# Patient Record
Sex: Female | Born: 1965 | Race: White | Hispanic: No | Marital: Married | Smoking: Never smoker
Health system: Southern US, Community
[De-identification: ages and names within clinical notes are randomized; demographics above are authoritative.]

## PROBLEM LIST (undated history)

## (undated) DIAGNOSIS — G43909 Migraine, unspecified, not intractable, without status migrainosus: Secondary | ICD-10-CM

## (undated) DIAGNOSIS — R7303 Prediabetes: Secondary | ICD-10-CM

## (undated) DIAGNOSIS — E785 Hyperlipidemia, unspecified: Secondary | ICD-10-CM

## (undated) DIAGNOSIS — I81 Portal vein thrombosis: Secondary | ICD-10-CM

## (undated) DIAGNOSIS — K529 Noninfective gastroenteritis and colitis, unspecified: Secondary | ICD-10-CM

## (undated) DIAGNOSIS — I10 Essential (primary) hypertension: Secondary | ICD-10-CM

## (undated) HISTORY — PX: DILATION AND CURETTAGE OF UTERUS: SHX78

## (undated) HISTORY — DX: Migraine, unspecified, not intractable, without status migrainosus: G43.909

## (undated) HISTORY — PX: COLONOSCOPY: SHX174

## (undated) HISTORY — PX: TONSILLECTOMY: SUR1361

## (undated) HISTORY — DX: Noninfective gastroenteritis and colitis, unspecified: K52.9

## (undated) HISTORY — DX: Portal vein thrombosis: I81

---

## 2004-10-01 HISTORY — PX: ANTERIOR CERVICAL DECOMP/DISCECTOMY FUSION: SHX1161

## 2004-10-01 HISTORY — PX: CERVICAL FUSION: SHX112

## 2005-05-18 ENCOUNTER — Ambulatory Visit (HOSPITAL_COMMUNITY): Admission: RE | Admit: 2005-05-18 | Discharge: 2005-05-18 | Payer: Self-pay | Admitting: Family Medicine

## 2005-05-29 ENCOUNTER — Inpatient Hospital Stay (HOSPITAL_COMMUNITY): Admission: RE | Admit: 2005-05-29 | Discharge: 2005-05-30 | Payer: Self-pay | Admitting: Neurosurgery

## 2014-06-01 ENCOUNTER — Encounter (INDEPENDENT_AMBULATORY_CARE_PROVIDER_SITE_OTHER): Payer: Self-pay

## 2014-07-12 ENCOUNTER — Ambulatory Visit (INDEPENDENT_AMBULATORY_CARE_PROVIDER_SITE_OTHER): Payer: Self-pay | Admitting: Internal Medicine

## 2014-08-16 ENCOUNTER — Ambulatory Visit (INDEPENDENT_AMBULATORY_CARE_PROVIDER_SITE_OTHER): Payer: BC Managed Care – PPO | Admitting: Internal Medicine

## 2014-08-16 ENCOUNTER — Encounter (INDEPENDENT_AMBULATORY_CARE_PROVIDER_SITE_OTHER): Payer: Self-pay | Admitting: Internal Medicine

## 2014-08-16 VITALS — BP 130/76 | HR 68 | Temp 97.8°F | Resp 16 | Ht 64.0 in | Wt 137.9 lb

## 2014-08-16 DIAGNOSIS — K589 Irritable bowel syndrome without diarrhea: Secondary | ICD-10-CM | POA: Insufficient documentation

## 2014-08-16 DIAGNOSIS — R109 Unspecified abdominal pain: Secondary | ICD-10-CM

## 2014-08-16 LAB — CBC WITH DIFFERENTIAL/PLATELET
BASOS ABS: 0.1 10*3/uL (ref 0.0–0.1)
Basophils Relative: 1 % (ref 0–1)
EOS ABS: 0.1 10*3/uL (ref 0.0–0.7)
Eosinophils Relative: 1 % (ref 0–5)
HEMATOCRIT: 42.8 % (ref 36.0–46.0)
HEMOGLOBIN: 14.1 g/dL (ref 12.0–15.0)
LYMPHS ABS: 2.3 10*3/uL (ref 0.7–4.0)
Lymphocytes Relative: 41 % (ref 12–46)
MCH: 29.9 pg (ref 26.0–34.0)
MCHC: 32.9 g/dL (ref 30.0–36.0)
MCV: 90.7 fL (ref 78.0–100.0)
MONOS PCT: 8 % (ref 3–12)
MPV: 9.6 fL (ref 9.4–12.4)
Monocytes Absolute: 0.5 10*3/uL (ref 0.1–1.0)
Neutro Abs: 2.8 10*3/uL (ref 1.7–7.7)
Neutrophils Relative %: 49 % (ref 43–77)
Platelets: 265 10*3/uL (ref 150–400)
RBC: 4.72 MIL/uL (ref 3.87–5.11)
RDW: 12.9 % (ref 11.5–15.5)
WBC: 5.7 10*3/uL (ref 4.0–10.5)

## 2014-08-16 MED ORDER — DOCUSATE SODIUM 100 MG PO CAPS: 200.0000 mg | ORAL_CAPSULE | Freq: Every day | ORAL | Status: AC

## 2014-08-16 MED ORDER — HYOSCYAMINE SULFATE 0.125 MG SL SUBL: 0.1250 mg | SUBLINGUAL_TABLET | Freq: Three times a day (TID) | SUBLINGUAL | Status: DC | PRN

## 2014-08-16 MED ORDER — PSYLLIUM 28 % PO PACK: 1.0000 | PACK | Freq: Every day | ORAL | Status: DC

## 2014-08-16 NOTE — Progress Notes (Signed)
Presenting complaint;  Abdominal pain and loose stools.  History of present illness;  Patient is 48 year old Caucasian female who was referred through courtesy of Dr. Scotty Court for GI evaluation. She has history of irritable bowel syndrome manifesting as need for frequent bowel movements and urgency and has been doing well with imipramine but now she presents with abdominal pain which is associated with urgency and diarrhea. She had couple of episodes around this time last year and did fine until 3 months ago and she had 3 episodes in August, 3 episodes in September and non-last month and so far she has had one episode this month. Each episode begins with severe midabdominal pain which is associated with nausea and followed by need to have a bowel movement with urgency. Initially she passes fecal balls with some relief of her pain which recurs and she has to go back to the bathroom again. Eventually she has diarrhea.  By next day she has soreness in her abdomen and feels bloated and unable to eat. She is back to normal usually after 2 days. None of these episodes has been associated with vomiting fever chills skin rash melena or rectal bleeding hematuria or dysuria. Her bowels usually move every third day. Before she took imipramine she was having multiple stools daily. She denies anorexia or weight loss. She also denies heartburn or dysphagia. She occasionally has what she describes to be half burps.   Current Medications: Outpatient Encounter Prescriptions as of 08/16/2014  Medication Sig  . imipramine (TOFRANIL) 10 MG tablet Take 10 mg by mouth 2 (two) times daily.  . Multiple Vitamins-Minerals (MULTI FOR HER PO) Take by mouth daily.  . rizatriptan (MAXALT) 10 MG tablet Take 10 mg by mouth as needed for migraine. May repeat in 2 hours if needed   Past medical history;  Tonsillectomy at age 63. Irritable bowel syndrome was diagnosed over 20 years ago. Neck surgery in 2006 with disc fusion; she  has metal plate and screws. She was diagnosed with portal vein thrombosis( left branch only) and remained on warfarin for 1 year. Abdominopelvic CT in October 2009 was negative for portal vein thrombosis. Workup apparently was negative for clotting disorders. She was on birth control pills at the time. She had EGD in October 2009 for epigastric pain. Was normal other than question of extrinsic compression at stomach and CT was negative She had colonoscopy also in October 2009 and was within normal limits.  Allergies; No Known Allergies  Family history;  Both pain and spotting good health. Father is 28 and mother is 60. She has 2 brothers ages if T58 and 95 and they're also in good health. Maternal grandfather had colon carcinoma in his 66s.  Social history;  She is married and has grown up daughter in good health. She has never smoke cigarettes but drinks alcohol occasionally. She works at [employer].  Physical examination; Blood pressure 130/76, pulse 68, temperature 97.8 F (36.6 C), temperature source Oral, resp. rate 16, height 5\' 4"  (1.626 m), weight 137 lb 14.4 oz (62.551 kg). Patient is alert and in no acute distress. Conjunctiva is pink. Sclera is nonicteric Oropharyngeal mucosa is normal. No neck masses or thyromegaly noted. Cardiac exam with regular rhythm normal S1 and S2. No murmur or gallop noted. Lungs are clear to auscultation. Abdomen is symmetrical. Bowel sounds are normal. No bruits noted. On palpation abdomen is soft and nontender without organomegaly or masses.  No LE edema or clubbing noted.  Labs/studies Results: Recent lab  data available.   Assessment:  #1. Patient is 48 year old Caucasian female with  history of IBS controlled with imipramine who presents with episodic mid abdominal pain associated with nausea and need for frequent defecation. She has no constitutional symptoms, melena or rectal bleeding. Symptoms are atypical for irritable bowel  syndrome. Differential diagnoses includes ischemic left-sided colitis but she has never experienced rectal bleeding.past history significant for thrombosis involving left branch of portal vein about 7 years ago.   Recommendations;  Hemoccult 1. CBC with differential. Metamucil 4 g by mouth daily at bedtime. Hyoscyamine sublingual1 tablet three times a day when necessary. Colace 200 mg by mouth daily at bedtime. Abdominopelvic CT with contrast. Patient will keep symptom diary until next office visit in two months.

## 2014-08-16 NOTE — Patient Instructions (Signed)
Keep symptom diary as discussed. Abdominopelvic CT with contrast to be scheduled following next episode. Patient will call with results of blood work

## 2014-08-18 ENCOUNTER — Telehealth (INDEPENDENT_AMBULATORY_CARE_PROVIDER_SITE_OTHER): Payer: Self-pay | Admitting: *Deleted

## 2014-08-18 NOTE — Telephone Encounter (Signed)
   Diagnosis:    Result(s)    Negative:     Card 2: Positive:  Negative:   Card 3: Positive:  Negative:    Completed by:    HEMOCCULT SENSA DEVELOPER: LOT#:   EXPIRATION DATE:    HEMOCCULT SENSA CARD:  LOT#:   EXPIRATION DATE:    CARD CONTROL RESULTS:  POSITIVE:  NEGATIVE:     ADDITIONAL COMMENTS:

## 2014-08-19 NOTE — Telephone Encounter (Signed)
Stool is guaiac negative. 

## 2014-08-19 NOTE — Telephone Encounter (Signed)
   Diagnosis:    Result(s)   Card 1:Negative:           Completed by: Thomas Hoff , LPN   HEMOCCULT SENSA DEVELOPER: LOT#:  [id number] EXPIRATION DATE: 2016-05   HEMOCCULT SENSA CARD:  LOT#: 02/14  EXPIRATION DATE: 07/18   CARD CONTROL RESULTS:  POSITIVE: Positive NEGATIVE: Negative    ADDITIONAL COMMENTS: Patient was called and made aware of her result.

## 2014-09-28 ENCOUNTER — Encounter (INDEPENDENT_AMBULATORY_CARE_PROVIDER_SITE_OTHER): Payer: Self-pay

## 2014-11-09 ENCOUNTER — Ambulatory Visit (INDEPENDENT_AMBULATORY_CARE_PROVIDER_SITE_OTHER): Payer: BC Managed Care – PPO | Admitting: Internal Medicine

## 2016-05-25 ENCOUNTER — Other Ambulatory Visit: Payer: Self-pay | Admitting: Family Medicine

## 2016-05-25 DIAGNOSIS — Z1231 Encounter for screening mammogram for malignant neoplasm of breast: Secondary | ICD-10-CM

## 2016-07-16 ENCOUNTER — Ambulatory Visit: Payer: BC Managed Care – PPO

## 2016-07-19 ENCOUNTER — Ambulatory Visit (INDEPENDENT_AMBULATORY_CARE_PROVIDER_SITE_OTHER): Payer: BC Managed Care – PPO | Admitting: Orthopaedic Surgery

## 2016-08-14 ENCOUNTER — Ambulatory Visit
Admission: RE | Admit: 2016-08-14 | Discharge: 2016-08-14 | Disposition: A | Discharge status: Home / Self Care | Payer: BC Managed Care – PPO | Source: Ambulatory Visit | Attending: Family Medicine | Admitting: Family Medicine

## 2016-08-14 DIAGNOSIS — Z1231 Encounter for screening mammogram for malignant neoplasm of breast: Secondary | ICD-10-CM

## 2017-11-19 ENCOUNTER — Other Ambulatory Visit: Payer: Self-pay | Admitting: Family Medicine

## 2017-11-19 DIAGNOSIS — Z1231 Encounter for screening mammogram for malignant neoplasm of breast: Secondary | ICD-10-CM

## 2017-12-06 ENCOUNTER — Ambulatory Visit
Admission: RE | Admit: 2017-12-06 | Discharge: 2017-12-06 | Disposition: A | Discharge status: Home / Self Care | Payer: BC Managed Care – PPO | Source: Ambulatory Visit | Attending: Family Medicine | Admitting: Family Medicine

## 2017-12-06 DIAGNOSIS — Z1231 Encounter for screening mammogram for malignant neoplasm of breast: Secondary | ICD-10-CM

## 2017-12-10 ENCOUNTER — Other Ambulatory Visit: Payer: Self-pay | Admitting: Family Medicine

## 2017-12-10 DIAGNOSIS — R921 Mammographic calcification found on diagnostic imaging of breast: Secondary | ICD-10-CM

## 2017-12-12 ENCOUNTER — Other Ambulatory Visit: Payer: Self-pay | Admitting: Family Medicine

## 2017-12-12 ENCOUNTER — Ambulatory Visit
Admission: RE | Admit: 2017-12-12 | Discharge: 2017-12-12 | Disposition: A | Discharge status: Home / Self Care | Payer: BC Managed Care – PPO | Source: Ambulatory Visit | Attending: Family Medicine | Admitting: Family Medicine

## 2017-12-12 DIAGNOSIS — R921 Mammographic calcification found on diagnostic imaging of breast: Secondary | ICD-10-CM

## 2018-06-24 ENCOUNTER — Encounter (INDEPENDENT_AMBULATORY_CARE_PROVIDER_SITE_OTHER): Payer: Self-pay | Admitting: *Deleted

## 2018-07-17 ENCOUNTER — Other Ambulatory Visit (INDEPENDENT_AMBULATORY_CARE_PROVIDER_SITE_OTHER): Payer: Self-pay | Admitting: *Deleted

## 2018-07-17 DIAGNOSIS — Z1211 Encounter for screening for malignant neoplasm of colon: Secondary | ICD-10-CM | POA: Insufficient documentation

## 2018-07-17 DIAGNOSIS — Z800 Family history of malignant neoplasm of digestive organs: Secondary | ICD-10-CM | POA: Insufficient documentation

## 2018-07-18 ENCOUNTER — Ambulatory Visit
Admission: RE | Admit: 2018-07-18 | Discharge: 2018-07-18 | Disposition: A | Discharge status: Home / Self Care | Payer: BC Managed Care – PPO | Source: Ambulatory Visit | Attending: Family Medicine | Admitting: Family Medicine

## 2018-07-18 ENCOUNTER — Other Ambulatory Visit: Payer: Self-pay | Admitting: Family Medicine

## 2018-07-18 DIAGNOSIS — R921 Mammographic calcification found on diagnostic imaging of breast: Secondary | ICD-10-CM

## 2018-09-03 ENCOUNTER — Telehealth (INDEPENDENT_AMBULATORY_CARE_PROVIDER_SITE_OTHER): Payer: Self-pay | Admitting: *Deleted

## 2018-09-03 ENCOUNTER — Encounter (INDEPENDENT_AMBULATORY_CARE_PROVIDER_SITE_OTHER): Payer: Self-pay | Admitting: *Deleted

## 2018-09-03 NOTE — Telephone Encounter (Signed)
Patient needs suprep 

## 2018-09-04 MED ORDER — SUPREP BOWEL PREP KIT 17.5-3.13-1.6 GM/177ML PO SOLN: 1.0000 | Freq: Once | ORAL | 0 refills | Status: AC

## 2018-09-30 ENCOUNTER — Telehealth (INDEPENDENT_AMBULATORY_CARE_PROVIDER_SITE_OTHER): Payer: Self-pay | Admitting: *Deleted

## 2018-09-30 NOTE — Telephone Encounter (Signed)
agree

## 2018-09-30 NOTE — Telephone Encounter (Signed)
Referring MD/PCP: kristen hess, [place] w/ dr nyland   Procedure: tcs  Reason/Indication:  Screening, fam hx colon ca  Has patient had this procedure before?  Yes, 10 yrs ago  If so, when, by whom and where?    Is there a family history of colon cancer?  Yes, maternal grandmother  Who?  What age when diagnosed?    Is patient diabetic?   no      Does patient have prosthetic heart valve or mechanical valve?  no  Do you have a pacemaker?  no  Has patient ever had endocarditis? no  Has patient had joint replacement within last 12 months?  no  Is patient constipated or do they take laxatives? no  Does patient have a history of alcohol/drug use?  no  Is patient on blood thinner such as Coumadin, Plavix and/or Aspirin? no  Medications: imipramine 50 mg 3 tabs nightly, triam/hctz 37.5/25 mg 1/2 tab daily  Allergies: nkda  Medication Adjustment per Dr Lindi Adie, NP:   Procedure date & time: 10/29/18 at 730

## 2018-10-29 ENCOUNTER — Other Ambulatory Visit: Payer: Self-pay

## 2018-10-29 ENCOUNTER — Encounter (HOSPITAL_COMMUNITY): Payer: Self-pay

## 2018-10-29 ENCOUNTER — Ambulatory Visit (HOSPITAL_COMMUNITY)
Admission: RE | Admit: 2018-10-29 | Discharge: 2018-10-29 | Disposition: A | Discharge status: Home / Self Care | Payer: BC Managed Care – PPO | Attending: Internal Medicine | Admitting: Internal Medicine

## 2018-10-29 ENCOUNTER — Encounter (HOSPITAL_COMMUNITY)
Admission: RE | Disposition: A | Discharge status: Home / Self Care | Payer: Self-pay | Source: Home / Self Care | Attending: Internal Medicine

## 2018-10-29 DIAGNOSIS — K589 Irritable bowel syndrome without diarrhea: Secondary | ICD-10-CM | POA: Diagnosis not present

## 2018-10-29 DIAGNOSIS — Z1211 Encounter for screening for malignant neoplasm of colon: Secondary | ICD-10-CM | POA: Insufficient documentation

## 2018-10-29 DIAGNOSIS — Z8249 Family history of ischemic heart disease and other diseases of the circulatory system: Secondary | ICD-10-CM | POA: Diagnosis not present

## 2018-10-29 DIAGNOSIS — D125 Benign neoplasm of sigmoid colon: Secondary | ICD-10-CM | POA: Diagnosis not present

## 2018-10-29 DIAGNOSIS — Z7982 Long term (current) use of aspirin: Secondary | ICD-10-CM | POA: Insufficient documentation

## 2018-10-29 DIAGNOSIS — Z800 Family history of malignant neoplasm of digestive organs: Secondary | ICD-10-CM | POA: Insufficient documentation

## 2018-10-29 DIAGNOSIS — Z79899 Other long term (current) drug therapy: Secondary | ICD-10-CM | POA: Insufficient documentation

## 2018-10-29 DIAGNOSIS — K648 Other hemorrhoids: Secondary | ICD-10-CM | POA: Diagnosis not present

## 2018-10-29 HISTORY — PX: POLYPECTOMY: SHX5525

## 2018-10-29 HISTORY — PX: COLONOSCOPY: SHX5424

## 2018-10-29 SURGERY — COLONOSCOPY
Anesthesia: Moderate Sedation

## 2018-10-29 MED ORDER — MEPERIDINE HCL 50 MG/ML IJ SOLN
INTRAMUSCULAR | Status: DC | PRN
  Administered 2018-10-29 (×2): 25 mg via INTRAVENOUS

## 2018-10-29 MED ORDER — SODIUM CHLORIDE 0.9 % IV SOLN
INTRAVENOUS | Status: DC
  Administered 2018-10-29: 07:00:00 via INTRAVENOUS

## 2018-10-29 MED ORDER — MIDAZOLAM HCL 5 MG/5ML IJ SOLN
INTRAMUSCULAR | Status: DC | PRN
  Administered 2018-10-29 (×3): 2 mg via INTRAVENOUS

## 2018-10-29 MED ORDER — DICYCLOMINE HCL 10 MG PO CAPS: 10.0000 mg | ORAL_CAPSULE | Freq: Three times a day (TID) | ORAL | 1 refills | Status: AC | PRN

## 2018-10-29 MED ORDER — STERILE WATER FOR IRRIGATION IR SOLN
Status: DC | PRN
  Administered 2018-10-29: 1.5 mL

## 2018-10-29 MED ORDER — MIDAZOLAM HCL 5 MG/5ML IJ SOLN
INTRAMUSCULAR | Status: AC
  Filled 2018-10-29: qty 10

## 2018-10-29 MED ORDER — MEPERIDINE HCL 50 MG/ML IJ SOLN
INTRAMUSCULAR | Status: AC
  Filled 2018-10-29: qty 1

## 2018-10-29 NOTE — H&P (Signed)
Rachael Charles is an 53 y.o. female.   Chief Complaint: Patient is here for colonoscopy. HPI: She is 53 year old Caucasian female who is here for screening colonoscopy.  She denies rectal bleeding but has intermittent bouts of diarrhea and left-sided abdominal pain felt to be due to IBS.  She has good appetite and has not lost any weight.  Last colonoscopy was normal in 2009.  Family history is significant for colon carcinoma in maternal grandfather when he was in his 55s.  He died in his 39s of unrelated causes.  Past Medical History:  Diagnosis Date  . Inflammatory bowel disease   . Migraine headache   . Portal vein thrombosis     Past Surgical History:  Procedure Laterality Date  . ANTERIOR CERVICAL DECOMP/DISCECTOMY FUSION  2006  . CERVICAL FUSION  2006  . COLONOSCOPY     2009  . DILATION AND CURETTAGE OF UTERUS     times 2  . TONSILLECTOMY     At age 35    Family History  Problem Relation Age of Onset  . Thyroid disease Mother   . Healthy Father   . Hypertension Brother   . Thyroid disease Brother   . Healthy Brother   . Healthy Child    Social History:  reports that she has never smoked. She has never used smokeless tobacco. She reports current alcohol use. She reports that she does not use drugs.  Allergies:  Allergies  Allergen Reactions  . Metoprolol Other (See Comments)    nightmares  . Diclofenac Nausea Only    Medications Prior to Admission  Medication Sig Dispense Refill  . aspirin-acetaminophen-caffeine (EXCEDRIN MIGRAINE) 250-250-65 MG tablet Take 2 tablets by mouth daily as needed for headache.    . imipramine (TOFRANIL) 50 MG tablet Take 150 mg by mouth at bedtime.    . triamterene-hydrochlorothiazide (MAXZIDE-25) 37.5-25 MG tablet Take 0.5 tablets by mouth daily.    Marland Kitchen docusate sodium (COLACE) 100 MG capsule Take 2 capsules (200 mg total) by mouth daily. (Patient taking differently: Take 200 mg by mouth daily as needed for mild constipation. ) 1 capsule  0  . rizatriptan (MAXALT) 10 MG tablet Take 10 mg by mouth as needed for migraine. May repeat in 2 hours if needed      No results found for this or any previous visit (from the past 48 hour(s)). No results found.  ROS  Blood pressure (!) 142/90, pulse 85, temperature 98.6 F (37 C), temperature source Oral, resp. rate 12, height 5\' 4"  (1.626 m), weight 60.3 kg, SpO2 96 %. Physical Exam  Constitutional: She appears well-developed and well-nourished.  HENT:  Mouth/Throat: Oropharynx is clear and moist.  Eyes: Conjunctivae are normal. No scleral icterus.  Neck: No thyromegaly present.  Scar across mid neck to the left.  Cardiovascular: Normal rate, regular rhythm and normal heart sounds.  No murmur heard. Respiratory: Effort normal and breath sounds normal.  GI: Soft. She exhibits no distension and no mass. There is no abdominal tenderness.  Musculoskeletal:        General: No edema.  Lymphadenopathy:    She has no cervical adenopathy.  Neurological: She is alert.  Skin: Skin is warm.  Tattoo over left pectoral region     Assessment/Plan Average rescreening colonoscopy.   Hildred Laser, MD 10/29/2018, 7:28 AM

## 2018-10-29 NOTE — Op Note (Signed)
[facility] Patient Name: Rachael Charles Procedure Date: 10/29/2018 7:08 AM MRN: [id number] Date of Birth: Feb 16, 1966 Attending MD: Hildred Laser , MD CSN: [id number] Age: 53 Admit Type: Outpatient Procedure:                Colonoscopy Indications:              Screening for colorectal malignant neoplasm Providers:                Hildred Laser, MD, Charlsie Quest. Theda Sers RN, RN, Aram Candela Referring MD:             Gaynell Face, [facility] Medicines:                Meperidine 50 mg IV, Midazolam 6 mg IV Complications:            No immediate complications. Estimated Blood Loss:     Estimated blood loss was minimal. Procedure:                Pre-Anesthesia Assessment:                           - Prior to the procedure, a History and Physical                            was performed, and patient medications and                            allergies were reviewed. The patient's tolerance of                            previous anesthesia was also reviewed. The risks                            and benefits of the procedure and the sedation                            options and risks were discussed with the patient.                            All questions were answered, and informed consent                            was obtained. Prior Anticoagulants: The patient                            last took aspirin 1 day prior to the procedure. ASA                            Grade Assessment: II - A patient with mild systemic                            disease. After reviewing the risks and benefits,  the patient was deemed in satisfactory condition to                            undergo the procedure.                           After obtaining informed consent, the colonoscope                            was passed under direct vision. Throughout the                            procedure, the patient's blood pressure, pulse, and   oxygen saturations were monitored continuously. The                            [id number]-[id number] ([id number]) scope was introduced through                            the anus and advanced to the the cecum, identified                            by appendiceal orifice and ileocecal valve. The                            colonoscopy was performed without difficulty. The                            patient tolerated the procedure well. The quality                            of the bowel preparation was good. The ileocecal                            valve, appendiceal orifice, and rectum were                            photographed. Scope In: 7:39:35 AM Scope Out: 8:01:12 AM Scope Withdrawal Time: 0 hours 14 minutes 25 seconds  Total Procedure Duration: 0 hours 21 minutes 37 seconds  Findings:      The perianal and digital rectal examinations were normal.      A 5 mm polyp was found in the distal sigmoid colon. The polyp was       sessile. The polyp was removed with a cold snare. Resection and       retrieval were complete.      The exam was otherwise normal throughout the examined colon.      Internal hemorrhoids were found during retroflexion. The hemorrhoids       were small. Impression:               - One 5 mm polyp in the distal sigmoid colon,                            removed with a cold snare. Resected and retrieved.                           -  Internal hemorrhoids. Moderate Sedation:      Moderate (conscious) sedation was administered by the endoscopy nurse       and supervised by the endoscopist. The following parameters were       monitored: oxygen saturation, heart rate, blood pressure, and response       to care. Total physician intraservice time was 28 minutes. Recommendation:           - Patient has a contact number available for                            emergencies. The signs and symptoms of potential                            delayed complications were discussed with the                             patient. Return to normal activities tomorrow.                            Written discharge instructions were provided to the                            patient.                           - Resume previous diet today.                           - Continue present medications.                           - No aspirin, ibuprofen, naproxen, or other                            non-steroidal anti-inflammatory drugs for 1 day.                           - Use Bentyl (dicyclomine) 10 mg PO TID prn.                           - Await pathology results.                           - Repeat colonoscopy is recommended. The                            colonoscopy date will be determined after pathology                            results from today's exam become available for                            review. Procedure Code(s):        --- Professional ---  [id number], Colonoscopy, flexible; with removal of                            tumor(s), polyp(s), or other lesion(s) by snare                            technique                           99153, Moderate sedation; each additional 15                            minutes intraservice time                           G0500, Moderate sedation services provided by the                            same physician or other qualified health care                            professional performing a gastrointestinal                            endoscopic service that sedation supports,                            requiring the presence of an independent trained                            observer to assist in the monitoring of the                            patient's level of consciousness and physiological                            status; initial 15 minutes of intra-service time;                            patient age 55 years or older (additional time may                            be reported with [phone], as appropriate) Diagnosis  Code(s):        --- Professional ---                           Z12.11, Encounter for screening for malignant                            neoplasm of colon                           D12.5, Benign neoplasm of sigmoid colon                           K64.8, Other  hemorrhoids CPT copyright 2018 American Medical Association. All rights reserved. The codes documented in this report are preliminary and upon coder review may  be revised to meet current compliance requirements. Hildred Laser, MD Hildred Laser, MD 10/29/2018 8:10:42 AM This report has been signed electronically. Number of Addenda: 0

## 2018-10-29 NOTE — Discharge Instructions (Signed)
No aspirin or NSAIDs for 24 hours. Resume other medications as before. Dicyclomine 10 mg up to 3 times a day as needed for abdominal cramps and diarrhea. Resume usual diet. No driving for 24 hours. Physician will call with biopsy results.   Colonoscopy, Adult, Care After This sheet gives you information about how to care for yourself after your procedure. Your doctor may also give you more specific instructions. If you have problems or questions, call your doctor. What can I expect after the procedure? After the procedure, it is common to have:  A small amount of blood in your poop for 24 hours.  Some gas.  Mild cramping or bloating in your belly. Follow these instructions at home: General instructions  For the first 24 hours after the procedure: ? Do not drive or use machinery. ? Do not sign important documents. ? Do not drink alcohol. ? Do your daily activities more slowly than normal. ? Eat foods that are soft and easy to digest.  Take over-the-counter or prescription medicines only as told by your doctor. To help cramping and bloating:   Try walking around.  Put heat on your belly (abdomen) as told by your doctor. Use a heat source that your doctor recommends, such as a moist heat pack or a heating pad. ? Put a towel between your skin and the heat source. ? Leave the heat on for 20-30 minutes. ? Remove the heat if your skin turns bright red. This is especially important if you cannot feel pain, heat, or cold. You can get burned. Eating and drinking   Drink enough fluid to keep your pee (urine) clear or pale yellow.  Return to your normal diet as told by your doctor. Avoid heavy or fried foods that are hard to digest.  Avoid drinking alcohol for as long as told by your doctor. Contact a doctor if:  You have blood in your poop (stool) 2-3 days after the procedure. Get help right away if:  You have more than a small amount of blood in your poop.  You see large  clumps of tissue (blood clots) in your poop.  Your belly is swollen.  You feel sick to your stomach (nauseous).  You throw up (vomit).  You have a fever.  You have belly pain that gets worse, and medicine does not help your pain. Summary  After the procedure, it is common to have a small amount of blood in your poop. You may also have mild cramping and bloating in your belly.  For the first 24 hours after the procedure, do not drive or use machinery, do not sign important documents, and do not drink alcohol.  Get help right away if you have a lot of blood in your poop, feel sick to your stomach, have a fever, or have more belly pain. This information is not intended to replace advice given to you by your health care provider. Make sure you discuss any questions you have with your health care provider. Document Released: 10/20/2010 Document Revised: 07/18/2017 Document Reviewed: 06/11/2016 Elsevier Interactive Patient Education  2019 [employer].  Colon Polyps  Polyps are tissue growths inside the body. Polyps can grow in many places, including the large intestine (colon). A polyp may be a round bump or a mushroom-shaped growth. You could have one polyp or several. Most colon polyps are noncancerous (benign). However, some colon polyps can become cancerous over time. Finding and removing the polyps early can help prevent this. What are the  causes? The exact cause of colon polyps is not known. What increases the risk? You are more likely to develop this condition if you:  Have a family history of colon cancer or colon polyps.  Are older than 18 or older than 45 if you are African American.  Have inflammatory bowel disease, such as ulcerative colitis or Crohn's disease.  Have certain hereditary conditions, such as: ? Familial adenomatous polyposis. ? Lynch syndrome. ? Turcot syndrome. ? Peutz-Jeghers syndrome.  Are overweight.  Smoke cigarettes.  Do not get enough  exercise.  Drink too much alcohol.  Eat a diet that is high in fat and red meat and low in fiber.  Had childhood cancer that was treated with abdominal radiation. What are the signs or symptoms? Most polyps do not cause symptoms. If you have symptoms, they may include:  Blood coming from your rectum when having a bowel movement.  Blood in your stool. The stool may look dark red or black.  Abdominal pain.  A change in bowel habits, such as constipation or diarrhea. How is this diagnosed? This condition is diagnosed with a colonoscopy. This is a procedure in which a lighted, flexible scope is inserted into the anus and then passed into the colon to examine the area. Polyps are sometimes found when a colonoscopy is done as part of routine cancer screening tests. How is this treated? Treatment for this condition involves removing any polyps that are found. Most polyps can be removed during a colonoscopy. Those polyps will then be tested for cancer. Additional treatment may be needed depending on the results of testing. Follow these instructions at home: Lifestyle  Maintain a healthy weight, or lose weight if recommended by your health care provider.  Exercise every day or as told by your health care provider.  Do not use any products that contain nicotine or tobacco, such as cigarettes and e-cigarettes. If you need help quitting, ask your health care provider.  If you drink alcohol, limit how much you have: ? 0-1 drink a day for women. ? 0-2 drinks a day for men.  Be aware of how much alcohol is in your drink. In the [place] one drink equals one 12 oz bottle of beer (355 mL), one 5 oz glass of wine (148 mL), or one 1 oz shot of hard liquor (44 mL). Eating and drinking   Eat foods that are high in fiber, such as fruits, vegetables, and whole grains.  Eat foods that are high in calcium and vitamin D, such as milk, cheese, yogurt, eggs, liver, fish, and broccoli.  Limit foods that  are high in fat, such as fried foods and desserts.  Limit the amount of red meat and processed meat you eat, such as hot dogs, sausage, bacon, and lunch meats. General instructions  Keep all follow-up visits as told by your health care provider. This is important. ? This includes having regularly scheduled colonoscopies. ? Talk to your health care provider about when you need a colonoscopy. Contact a health care provider if:  You have new or worsening bleeding during a bowel movement.  You have new or increased blood in your stool.  You have a change in bowel habits.  You lose weight for no known reason. Summary  Polyps are tissue growths inside the body. Polyps can grow in many places, including the colon.  Most colon polyps are noncancerous (benign), but some can become cancerous over time.  This condition is diagnosed with a colonoscopy.  Treatment  for this condition involves removing any polyps that are found. Most polyps can be removed during a colonoscopy. This information is not intended to replace advice given to you by your health care provider. Make sure you discuss any questions you have with your health care provider. Document Released: 06/13/2004 Document Revised: 01/02/2018 Document Reviewed: 01/02/2018 Elsevier Interactive Patient Education  2019 [employer].

## 2018-10-31 ENCOUNTER — Encounter (HOSPITAL_COMMUNITY): Payer: Self-pay | Admitting: Internal Medicine

## 2018-10-31 IMAGING — MG 2D DIGITAL SCREENING BILATERAL MAMMOGRAM WITH CAD AND ADJUNCT TO
9 of 12 series · 9 of 28 positions shown · non-contrast
Comparison: Previous exam(s).

CLINICAL DATA: Screening.

EXAM:
2D DIGITAL SCREENING BILATERAL MAMMOGRAM WITH CAD AND ADJUNCT TOMO

[L MLO]
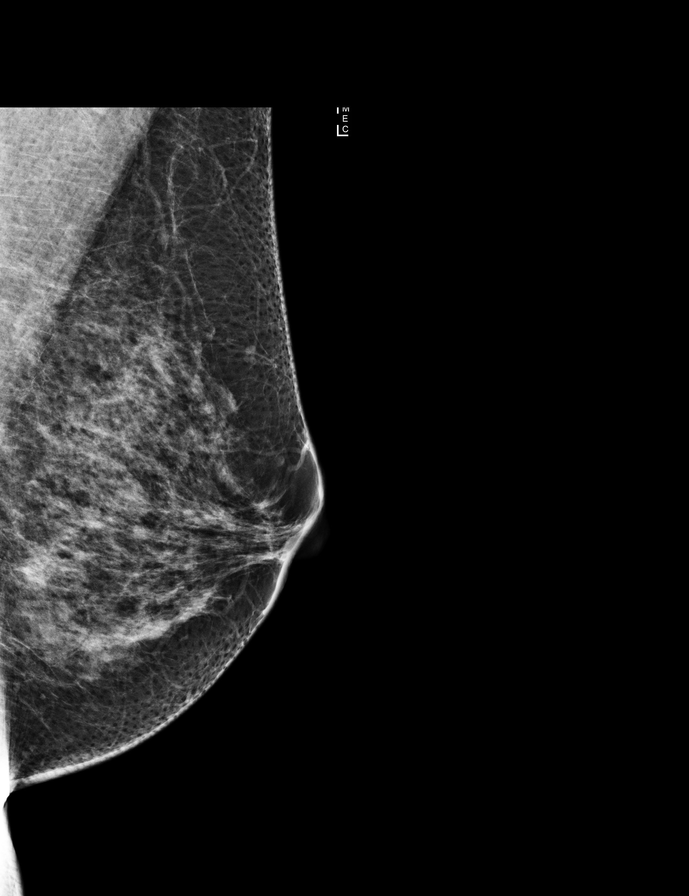

[L CC synth-2D]
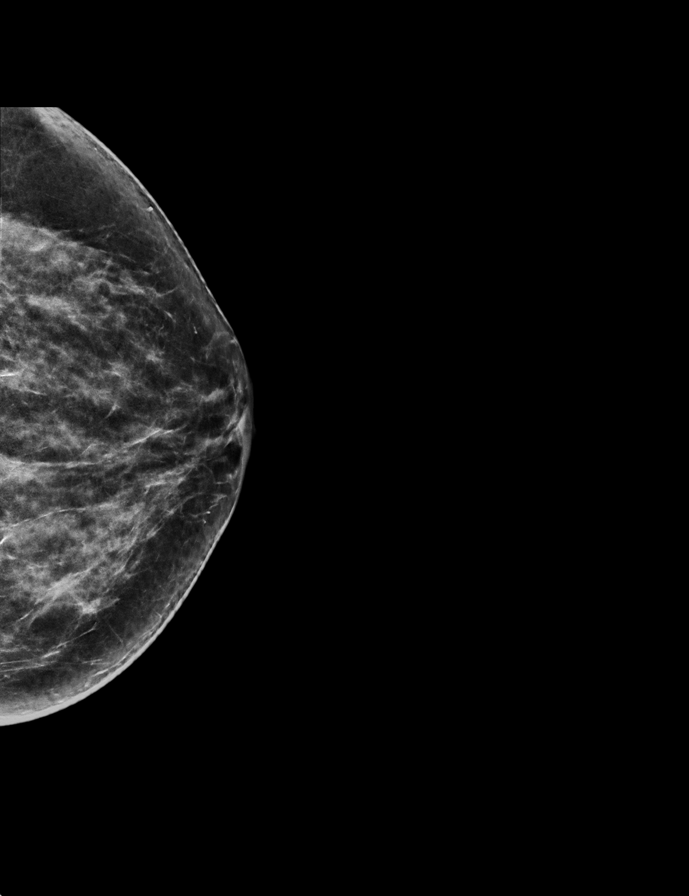

[R MLO]
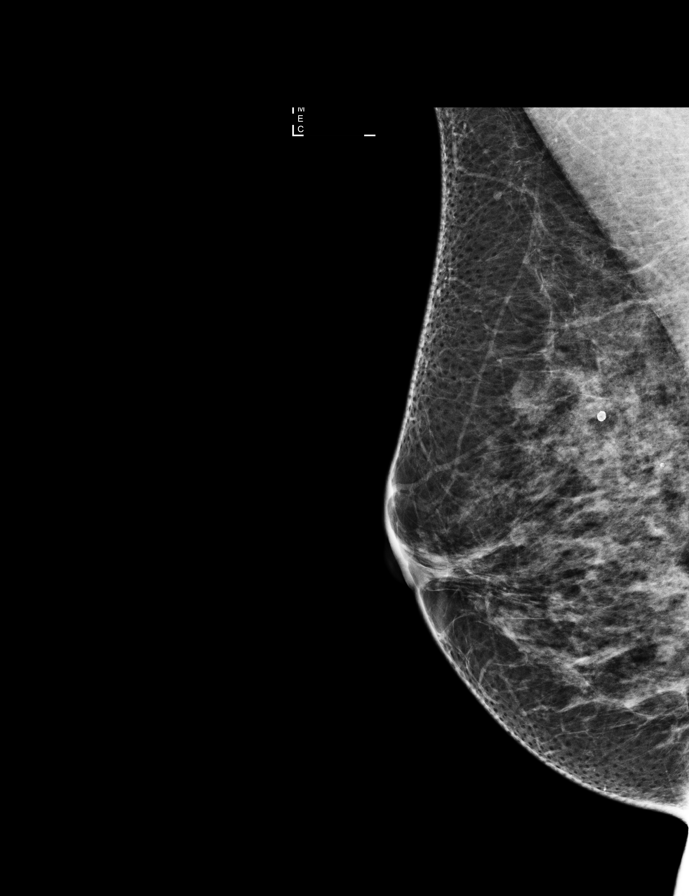

[R CC synth-2D]
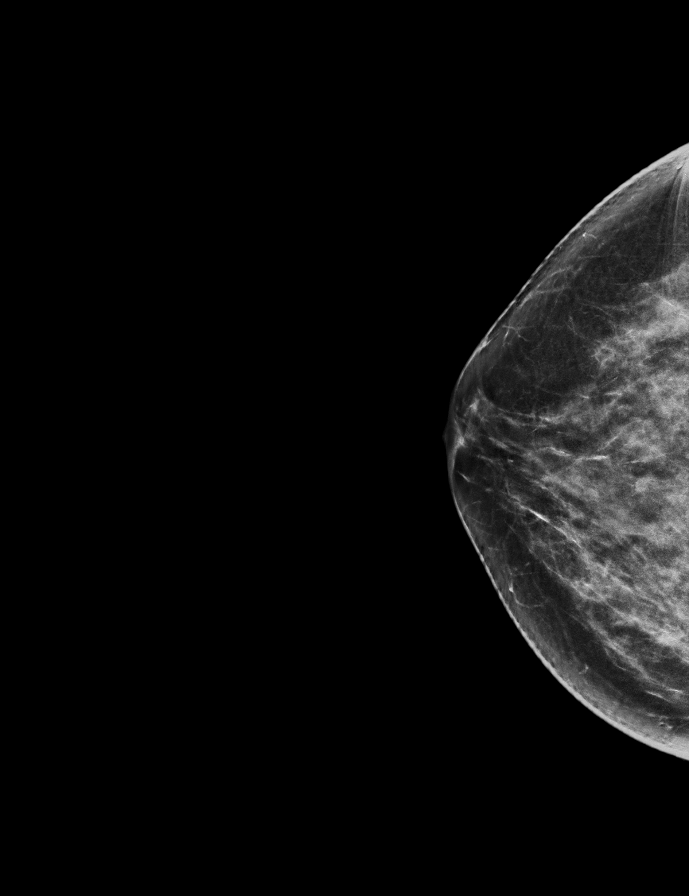

[L CC]
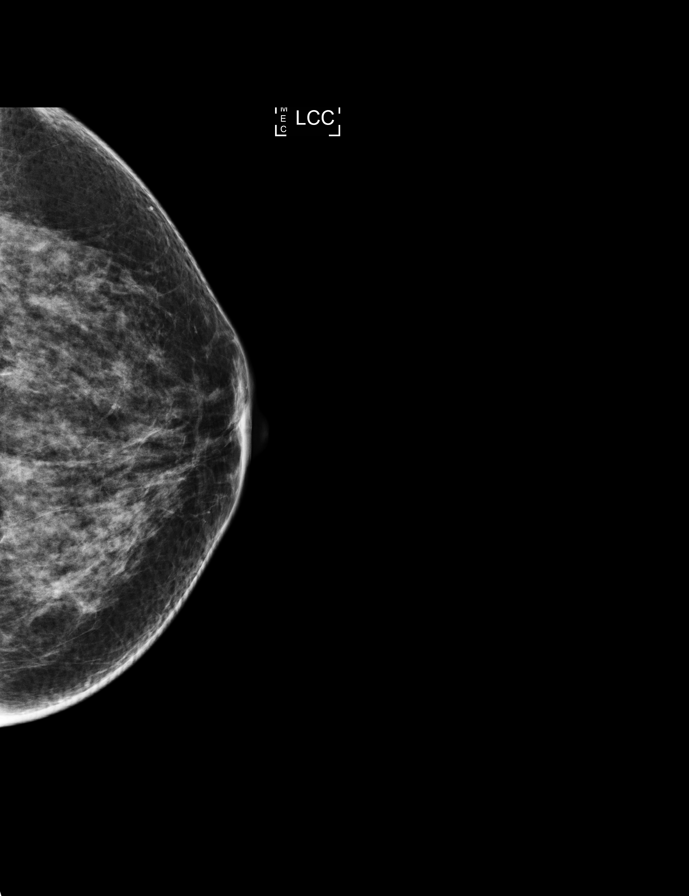

[R MLO synth-2D]
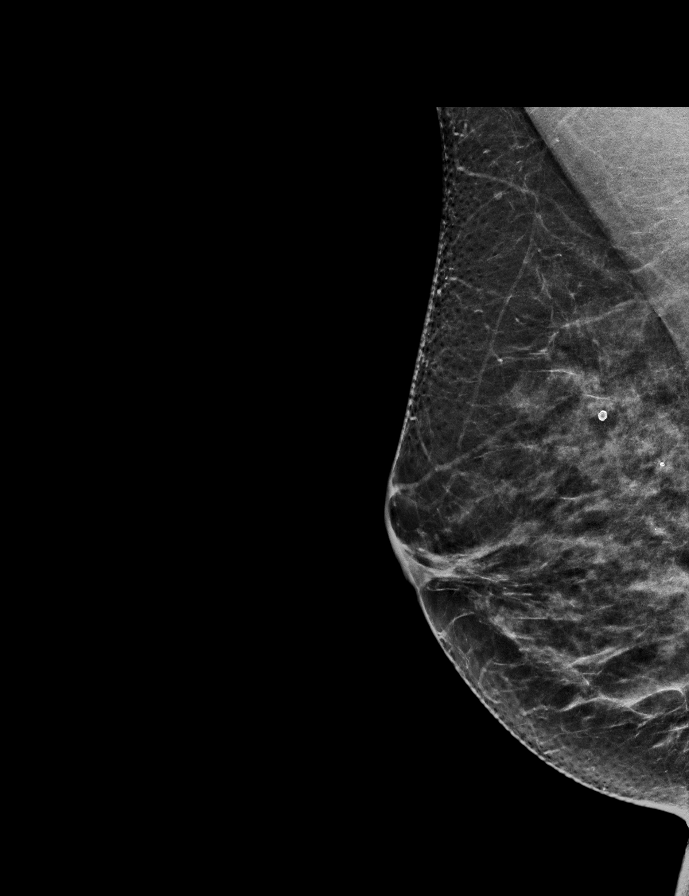

[R CC]
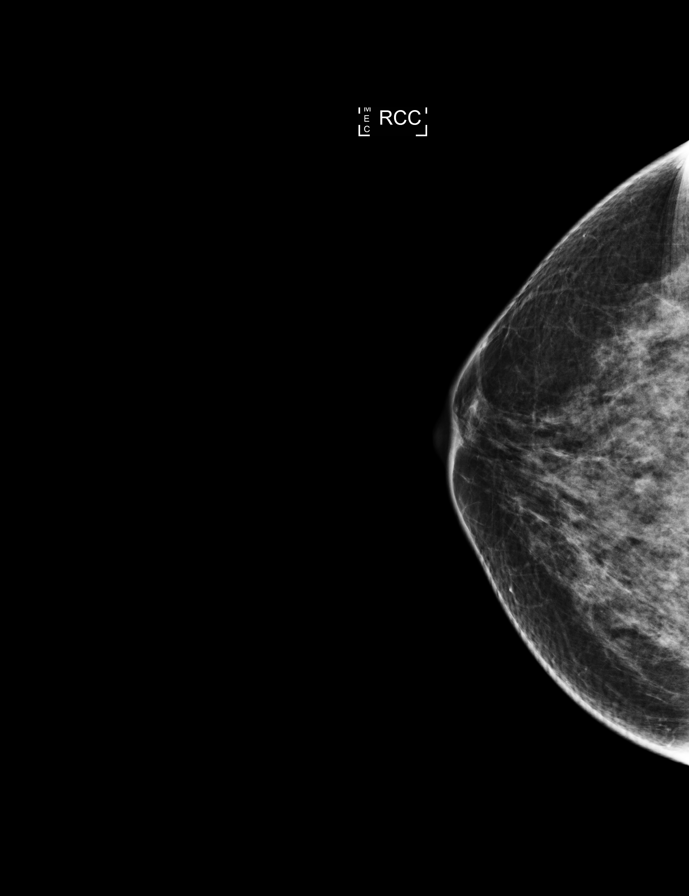

[L MLO synth-2D]
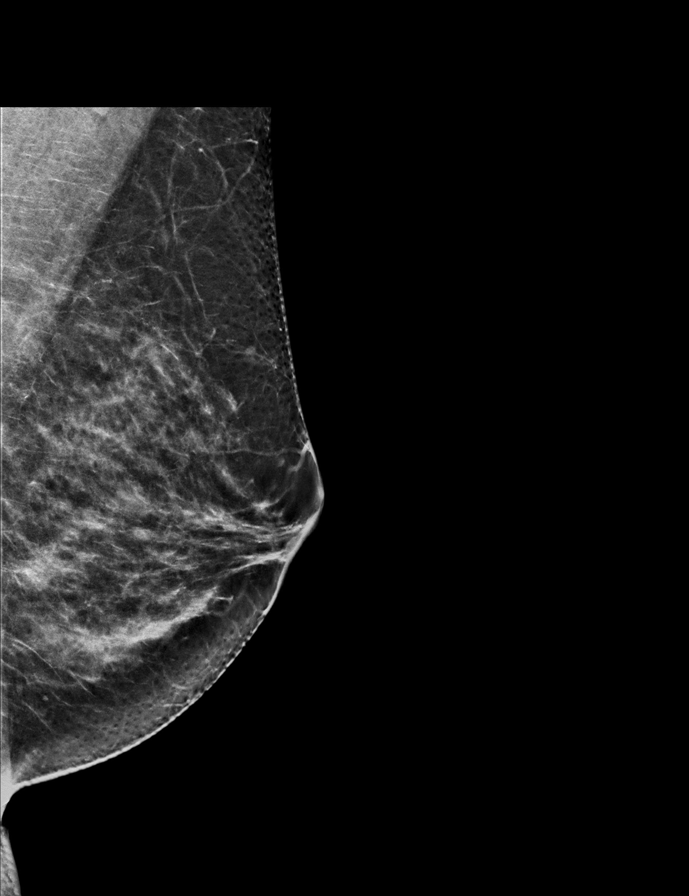

[R MLO tomo · tomo slice 31/60.0]
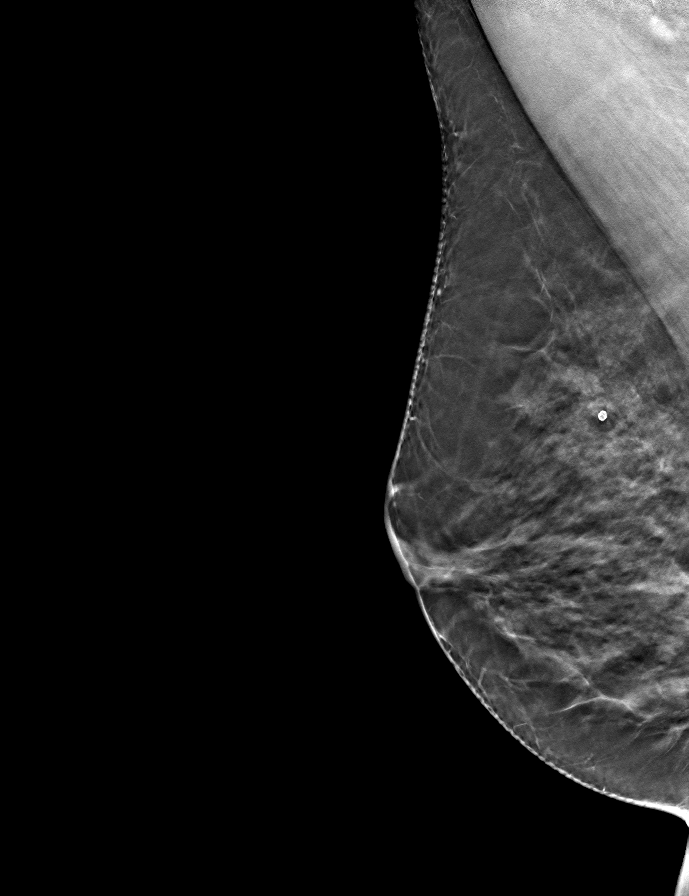

[9 of 28 positions shown; findings below may reference images not displayed]

ACR Breast Density Category c: The breast tissue is heterogeneously
dense, which may obscure small masses.
FINDINGS: There are no findings suspicious for malignancy. Images were
processed with CAD.
IMPRESSION: No mammographic evidence of malignancy. A result letter of this
screening mammogram will be mailed directly to the patient.

RECOMMENDATION:
Screening mammogram in one year. (Code:[id number])

BI-RADS CATEGORY  1: Negative.

## 2019-03-20 ENCOUNTER — Other Ambulatory Visit: Payer: Self-pay

## 2019-03-20 ENCOUNTER — Ambulatory Visit
Admission: RE | Admit: 2019-03-20 | Discharge: 2019-03-20 | Disposition: A | Discharge status: Home / Self Care | Payer: BC Managed Care – PPO | Source: Ambulatory Visit | Attending: Family Medicine | Admitting: Family Medicine

## 2019-03-20 DIAGNOSIS — R921 Mammographic calcification found on diagnostic imaging of breast: Secondary | ICD-10-CM

## 2020-02-09 ENCOUNTER — Other Ambulatory Visit: Payer: Self-pay | Admitting: Physician Assistant

## 2020-02-09 DIAGNOSIS — Z1231 Encounter for screening mammogram for malignant neoplasm of breast: Secondary | ICD-10-CM

## 2020-03-21 ENCOUNTER — Other Ambulatory Visit: Payer: Self-pay

## 2020-03-21 ENCOUNTER — Ambulatory Visit
Admission: RE | Admit: 2020-03-21 | Discharge: 2020-03-21 | Disposition: A | Discharge status: Home / Self Care | Payer: BC Managed Care – PPO | Source: Ambulatory Visit | Attending: Physician Assistant | Admitting: Physician Assistant

## 2020-03-21 DIAGNOSIS — Z1231 Encounter for screening mammogram for malignant neoplasm of breast: Secondary | ICD-10-CM

## 2021-09-11 ENCOUNTER — Other Ambulatory Visit: Payer: Self-pay | Admitting: Internal Medicine

## 2021-09-11 DIAGNOSIS — Z1231 Encounter for screening mammogram for malignant neoplasm of breast: Secondary | ICD-10-CM

## 2021-10-20 ENCOUNTER — Ambulatory Visit
Admission: RE | Admit: 2021-10-20 | Discharge: 2021-10-20 | Disposition: A | Discharge status: Home / Self Care | Payer: BC Managed Care – PPO | Source: Ambulatory Visit | Attending: Internal Medicine | Admitting: Internal Medicine

## 2021-10-20 DIAGNOSIS — Z1231 Encounter for screening mammogram for malignant neoplasm of breast: Secondary | ICD-10-CM

## 2022-02-14 ENCOUNTER — Encounter (HOSPITAL_COMMUNITY): Payer: Self-pay

## 2022-02-14 ENCOUNTER — Other Ambulatory Visit: Payer: Self-pay

## 2022-02-14 ENCOUNTER — Emergency Department (HOSPITAL_COMMUNITY)
Admission: EM | Admit: 2022-02-14 | Discharge: 2022-02-14 | Disposition: A | Discharge status: Home / Self Care | Payer: BC Managed Care – PPO | Attending: Emergency Medicine | Admitting: Emergency Medicine

## 2022-02-14 DIAGNOSIS — S0990XA Unspecified injury of head, initial encounter: Secondary | ICD-10-CM | POA: Diagnosis present

## 2022-02-14 DIAGNOSIS — S0083XA Contusion of other part of head, initial encounter: Secondary | ICD-10-CM | POA: Insufficient documentation

## 2022-02-14 DIAGNOSIS — W01198A Fall on same level from slipping, tripping and stumbling with subsequent striking against other object, initial encounter: Secondary | ICD-10-CM | POA: Insufficient documentation

## 2022-02-14 DIAGNOSIS — W19XXXA Unspecified fall, initial encounter: Secondary | ICD-10-CM

## 2022-02-14 HISTORY — DX: Hyperlipidemia, unspecified: E78.5

## 2022-02-14 HISTORY — DX: Essential (primary) hypertension: I10

## 2022-02-14 NOTE — ED Triage Notes (Signed)
Patient reports fall this past Sunday when she tripped over post by pool. Noted with wounds to forehead, nose and right knee. Concerned about swelling. Not on thinner, no LOC, no pain reported.  ?

## 2022-02-14 NOTE — ED Provider Notes (Signed)
?[facility] ?Provider Note ? ? ?CSN: [id number] ?Arrival date & time: 02/14/22  6578 ? ?  ? ?History ? ?Chief Complaint  ?Patient presents with  ? Fall  ? ? ?Rachael Charles is a 56 y.o. female present emergency department for mechanical fall 3 days ago at the pool, says she tripped and she fell and she struck her forehead and her nose on the concrete.  No loss of consciousness.  She put ice on it at that time.  She says that she has had some swelling around the site of the contusions, but denies persistent headache, nausea, vomiting, photophobia, or any strokelike symptoms.  She is not on blood thinners.  She wanted to come get checked out because she noted the swelling was a little more pronounced around her eyes this morning.  She denies difficulty breathing. ? ?N  ? ? ?  ? ?Home Medications ?Prior to Admission medications   ?Medication Sig Start Date End Date Taking? Authorizing Provider  ?aspirin-acetaminophen-caffeine (EXCEDRIN MIGRAINE) 250-250-65 MG tablet Take 2 tablets by mouth daily as needed for headache.    [provider]  ?dicyclomine (BENTYL) 10 MG capsule Take 1 capsule (10 mg total) by mouth 3 (three) times daily as needed. 10/29/18   Rogene Houston, MD  ?docusate sodium (COLACE) 100 MG capsule Take 2 capsules (200 mg total) by mouth daily. ?Patient taking differently: Take 200 mg by mouth daily as needed for mild constipation.  08/16/14   Rogene Houston, MD  ?imipramine (TOFRANIL) 50 MG tablet Take 150 mg by mouth at bedtime.    [provider]  ?rizatriptan (MAXALT) 10 MG tablet Take 10 mg by mouth as needed for migraine. May repeat in 2 hours if needed    [provider]  ?triamterene-hydrochlorothiazide (MAXZIDE-25) 37.5-25 MG tablet Take 0.5 tablets by mouth daily.    [provider]  ?   ? ?Allergies    ?Metoprolol and Diclofenac   ? ?Review of Systems   ?Review of Systems ? ?Physical Exam ?Updated Vital Signs ?BP (!) 159/84   Pulse 81    Temp 98 ?F (36.7 ?C) (Oral)   Resp 18   Ht '5\' 4"'$  (1.626 m)   Wt 60.8 kg   SpO2 98%   BMI 23.00 kg/m?  ?Physical Exam ?Constitutional:   ?   General: She is not in acute distress. ?HENT:  ?   Head: Normocephalic. No raccoon eyes or Battle's sign.  ? ?   Comments: Abrasion and contusion to the right anterior forehead and the bridge of the nose ? ?   Nose: No nasal deformity, septal deviation or congestion.  ?   Right Nostril: No epistaxis or septal hematoma.  ?   Left Nostril: No epistaxis or septal hematoma.  ?   Right Sinus: No maxillary sinus tenderness or frontal sinus tenderness.  ?   Left Sinus: No maxillary sinus tenderness or frontal sinus tenderness.  ?Eyes:  ?   Conjunctiva/sclera: Conjunctivae normal.  ?   Pupils: Pupils are equal, round, and reactive to light.  ?Cardiovascular:  ?   Rate and Rhythm: Normal rate and regular rhythm.  ?Skin: ?   General: Skin is warm and dry.  ?Neurological:  ?   General: No focal deficit present.  ?   Mental Status: She is alert and oriented to person, place, and time. Mental status is at baseline.  ?   Sensory: No sensory deficit.  ?   Motor: No weakness.  ?  Psychiatric:     ?   Mood and Affect: Mood normal.     ?   Behavior: Behavior normal.  ? ? ?ED Results / Procedures / Treatments   ?Labs ?(all labs ordered are listed, but only abnormal results are displayed) ?Labs Reviewed - No data to display ? ?EKG ?None ? ?Radiology ?No results found. ? ?Procedures ?Procedures  ? ? ?Medications Ordered in ED ?Medications - No data to display ? ?ED Course/ Medical Decision Making/ A&P ?  ?                        ?Medical Decision Making ? ?Patient is here with a mechanical fall 3 days ago and an injury to her forehead and face.  I do not see evidence of surgical fracture of the nose or septal hematoma.  She has no headache after 3 days from her fall, no concussion symptoms, and no acute risk factors for brain bleed.  We did discuss the options of CT imaging at this time to  rule out bleeding versus watchful waiting with return precautions, and she would prefer to forego the CT scan at this time. ? ?I do not see any other significant traumatic injuries on exam.  Low suspicion for spinal fracture. ? ? ? ? ? ? ? ?Final Clinical Impression(s) / ED Diagnoses ?Final diagnoses:  ?Fall, initial encounter  ?Contusion of face, initial encounter  ? ? ?Rx / DC Orders ?ED Discharge Orders   ? ? None  ? ?  ? ? ?  ?Wyvonnia Dusky, MD ?02/14/22 1659 ? ?

## 2022-10-03 ENCOUNTER — Other Ambulatory Visit: Payer: Self-pay | Admitting: Internal Medicine

## 2022-10-03 DIAGNOSIS — Z1231 Encounter for screening mammogram for malignant neoplasm of breast: Secondary | ICD-10-CM

## 2022-11-14 ENCOUNTER — Ambulatory Visit
Admission: RE | Admit: 2022-11-14 | Discharge: 2022-11-14 | Disposition: A | Discharge status: Home / Self Care | Payer: BC Managed Care – PPO | Source: Ambulatory Visit | Attending: Internal Medicine | Admitting: Internal Medicine

## 2022-11-14 DIAGNOSIS — Z1231 Encounter for screening mammogram for malignant neoplasm of breast: Secondary | ICD-10-CM

## 2023-10-04 ENCOUNTER — Encounter (INDEPENDENT_AMBULATORY_CARE_PROVIDER_SITE_OTHER): Payer: Self-pay | Admitting: *Deleted

## 2023-12-12 ENCOUNTER — Telehealth (INDEPENDENT_AMBULATORY_CARE_PROVIDER_SITE_OTHER): Payer: Self-pay | Admitting: Gastroenterology

## 2023-12-12 DIAGNOSIS — Z860100 Personal history of colon polyps, unspecified: Secondary | ICD-10-CM

## 2023-12-12 NOTE — Telephone Encounter (Signed)
 Ok to schedule.  Room 1/2  Thanks,  Vista Lawman, MD Gastroenterology and Hepatology [facility] Gastroenterology

## 2023-12-12 NOTE — Telephone Encounter (Signed)
 Who is your primary care physician: Mitzi Hansen  Reasons for the colonoscopy:   Have you had a colonoscopy before?  Yes 10/29/18  Do you have family history of colon cancer? Yes maternal grandpa  Previous colonoscopy with polyps removed? Yes 10/29/2018  Do you have a history colorectal cancer?   no  Are you diabetic? If yes, Type 1 or Type 2?    no  Do you have a prosthetic or mechanical heart valve? no  Do you have a pacemaker/defibrillator?   no  Have you had endocarditis/atrial fibrillation? no  Have you had joint replacement within the last 12 months?  no  Do you tend to be constipated or have to use laxatives? no  Do you have any history of drugs or alchohol?  no  Do you use supplemental oxygen?  no  Have you had a stroke or heart attack within the last 6 months? no  Do you take weight loss medication?  no  For female patients: have you had a hysterectomy?  no                                     are you post menopausal?       yes                                            do you still have your menstrual cycle? no      Do you take any blood-thinning medications such as: (aspirin, warfarin, Plavix, Aggrenox)  no  If yes we need the name, milligram, dosage and who is prescribing doctor  Current Outpatient Medications on File Prior to Visit  Medication Sig Dispense Refill   imipramine (TOFRANIL) 50 MG tablet Take 150 mg by mouth at bedtime.     triamterene-hydrochlorothiazide (MAXZIDE-25) 37.5-25 MG tablet Take 0.5 tablets by mouth daily.     aspirin-acetaminophen-caffeine (EXCEDRIN MIGRAINE) 250-250-65 MG tablet Take 2 tablets by mouth daily as needed for headache. (Patient not taking: Reported on 12/12/2023)     atorvastatin (LIPITOR) 10 MG tablet SMARTSIG:1 Tablet(s) By Mouth Every Evening     dicyclomine (BENTYL) 10 MG capsule Take 1 capsule (10 mg total) by mouth 3 (three) times daily as needed. (Patient not taking: Reported on 12/12/2023) 60 capsule 1   docusate  sodium (COLACE) 100 MG capsule Take 2 capsules (200 mg total) by mouth daily. (Patient not taking: Reported on 12/12/2023) 1 capsule 0   rizatriptan (MAXALT) 10 MG tablet Take 10 mg by mouth as needed for migraine. May repeat in 2 hours if needed (Patient not taking: Reported on 12/12/2023)     No current facility-administered medications on file prior to visit.    Allergies  Allergen Reactions   Metoprolol Other (See Comments)    nightmares   Diclofenac Nausea Only     Pharmacy: [facility]  Primary Insurance Name: [employer] number where you can be reached: [phone]

## 2023-12-17 NOTE — Telephone Encounter (Signed)
 Left message to return call

## 2023-12-18 MED ORDER — PEG 3350-KCL-NA BICARB-NACL 420 G PO SOLR: 4000.0000 mL | Freq: Once | ORAL | 0 refills | Status: AC

## 2023-12-18 NOTE — Telephone Encounter (Signed)
 Questionnaire from recall, no referral needed

## 2023-12-18 NOTE — Telephone Encounter (Signed)
 Pt contacted and scheduled. Availity is down at the moment; will complete PA once it is restored. Instructions mailed to pt. Prep sent to pharmacy.

## 2023-12-18 NOTE — Addendum Note (Signed)
 Addended by: Marlowe Shores on: 12/18/2023 10:49 AM   Modules accepted: Orders

## 2023-12-19 NOTE — Telephone Encounter (Signed)
 Spoke to pt insurance and no pre cert is needed for TCS

## 2024-01-10 ENCOUNTER — Other Ambulatory Visit (HOSPITAL_COMMUNITY)
Admission: RE | Admit: 2024-01-10 | Discharge: 2024-01-10 | Disposition: A | Discharge status: Home / Self Care | Source: Ambulatory Visit | Attending: Gastroenterology | Admitting: Gastroenterology

## 2024-01-10 DIAGNOSIS — Z860100 Personal history of colon polyps, unspecified: Secondary | ICD-10-CM | POA: Diagnosis present

## 2024-01-10 DIAGNOSIS — Z09 Encounter for follow-up examination after completed treatment for conditions other than malignant neoplasm: Secondary | ICD-10-CM | POA: Insufficient documentation

## 2024-01-10 LAB — BASIC METABOLIC PANEL WITH GFR
Anion gap: 12 (ref 5–15)
BUN: 17 mg/dL (ref 6–20)
CO2: 25 mmol/L (ref 22–32)
Calcium: 9.7 mg/dL (ref 8.9–10.3)
Chloride: 98 mmol/L (ref 98–111)
Creatinine, Ser: 0.85 mg/dL (ref 0.44–1.00)
GFR, Estimated: >60 mL/min (ref >60)
Glucose, Bld: 125 mg/dL — ABNORMAL HIGH (ref 70–99)
Potassium: 4.3 mmol/L (ref 3.5–5.1)
Sodium: 135 mmol/L (ref 135–145)

## 2024-01-13 ENCOUNTER — Ambulatory Visit (HOSPITAL_BASED_OUTPATIENT_CLINIC_OR_DEPARTMENT_OTHER)

## 2024-01-13 ENCOUNTER — Ambulatory Visit (HOSPITAL_COMMUNITY)

## 2024-01-13 ENCOUNTER — Other Ambulatory Visit: Payer: Self-pay

## 2024-01-13 ENCOUNTER — Ambulatory Visit (HOSPITAL_COMMUNITY)
Admission: RE | Admit: 2024-01-13 | Discharge: 2024-01-13 | Disposition: A | Discharge status: Home / Self Care | Payer: Self-pay | Attending: Gastroenterology | Admitting: Gastroenterology

## 2024-01-13 ENCOUNTER — Encounter (HOSPITAL_COMMUNITY): Payer: Self-pay | Admitting: Gastroenterology

## 2024-01-13 ENCOUNTER — Encounter (HOSPITAL_COMMUNITY)
Admission: RE | Disposition: A | Discharge status: Home / Self Care | Payer: Self-pay | Source: Home / Self Care | Attending: Gastroenterology

## 2024-01-13 DIAGNOSIS — D122 Benign neoplasm of ascending colon: Secondary | ICD-10-CM | POA: Diagnosis not present

## 2024-01-13 DIAGNOSIS — Z800 Family history of malignant neoplasm of digestive organs: Secondary | ICD-10-CM | POA: Insufficient documentation

## 2024-01-13 DIAGNOSIS — Z860100 Personal history of colon polyps, unspecified: Secondary | ICD-10-CM | POA: Diagnosis present

## 2024-01-13 DIAGNOSIS — K648 Other hemorrhoids: Secondary | ICD-10-CM

## 2024-01-13 DIAGNOSIS — Z1211 Encounter for screening for malignant neoplasm of colon: Secondary | ICD-10-CM | POA: Insufficient documentation

## 2024-01-13 DIAGNOSIS — I10 Essential (primary) hypertension: Secondary | ICD-10-CM | POA: Diagnosis not present

## 2024-01-13 DIAGNOSIS — Z8249 Family history of ischemic heart disease and other diseases of the circulatory system: Secondary | ICD-10-CM | POA: Insufficient documentation

## 2024-01-13 HISTORY — PX: COLONOSCOPY: SHX5424

## 2024-01-13 HISTORY — DX: Prediabetes: R73.03

## 2024-01-13 LAB — HM COLONOSCOPY

## 2024-01-13 SURGERY — COLONOSCOPY
Anesthesia: Choice

## 2024-01-13 MED ORDER — LACTATED RINGERS IV SOLN
INTRAVENOUS | Status: DC | PRN
Start: 2024-01-13 — End: 2024-01-13

## 2024-01-13 MED ORDER — LIDOCAINE HCL (PF) 2 % IJ SOLN
INTRAMUSCULAR | Status: DC | PRN
  Administered 2024-01-13: 50 mg via INTRADERMAL

## 2024-01-13 MED ORDER — PROPOFOL 10 MG/ML IV BOLUS
INTRAVENOUS | Status: DC | PRN
Start: 2024-01-13 — End: 2024-01-13
  Administered 2024-01-13: 50 mg via INTRAVENOUS
  Administered 2024-01-13: 100 mg via INTRAVENOUS

## 2024-01-13 MED ORDER — PROPOFOL 500 MG/50ML IV EMUL
INTRAVENOUS | Status: DC | PRN
Start: 2024-01-13 — End: 2024-01-13
  Administered 2024-01-13: 150 ug/kg/min via INTRAVENOUS

## 2024-01-13 NOTE — Anesthesia Postprocedure Evaluation (Signed)
 Anesthesia Post Note  Patient: Rachael Charles  Procedure(s) Performed: COLONOSCOPY  Patient location during evaluation: PACU Anesthesia Type: MAC Level of consciousness: awake and alert Pain management: pain level controlled Vital Signs Assessment: post-procedure vital signs reviewed and stable Respiratory status: spontaneous breathing, nonlabored ventilation, respiratory function stable and patient connected to nasal cannula oxygen Cardiovascular status: stable and blood pressure returned to baseline Postop Assessment: no apparent nausea or vomiting Anesthetic complications: no   No notable events documented.   Last Vitals:  Vitals:   01/13/24 0647 01/13/24 0752  BP: (!) 175/86 125/63  Pulse: 91 84  Resp: 15 14  Temp: 36.8 C 36.5 C  SpO2: 100% 98%    Last Pain:  Vitals:   01/13/24 0752  TempSrc: Oral  PainSc: 0-No pain                 Beacher Limerick

## 2024-01-13 NOTE — Anesthesia Preprocedure Evaluation (Addendum)
 Anesthesia Evaluation    Airway Mallampati: II  TM Distance: >3 FB     Dental no notable dental hx. (+) Teeth Intact   Pulmonary    Pulmonary exam normal        Cardiovascular hypertension, Normal cardiovascular exam Rhythm:Regular Rate:Normal     Neuro/Psych  Headaches    GI/Hepatic   Endo/Other    Renal/GU      Musculoskeletal   Abdominal Normal abdominal exam  (+)   Peds  Hematology   Anesthesia Other Findings   Reproductive/Obstetrics                             Anesthesia Physical Anesthesia Plan  ASA: 2  Anesthesia Plan: General   Post-op Pain Management:    Induction: Intravenous  PONV Risk Score and Plan: 1  Airway Management Planned: Nasal ETT  Additional Equipment:   Intra-op Plan:   Post-operative Plan:   Informed Consent: I have reviewed the patients History and Physical, chart, labs and discussed the procedure including the risks, benefits and alternatives for the proposed anesthesia with the patient or authorized representative who has indicated his/her understanding and acceptance.       Plan Discussed with: CRNA and Surgeon  Anesthesia Plan Comments:        Anesthesia Quick Evaluation

## 2024-01-13 NOTE — Op Note (Signed)
 [facility] Patient Name: Rachael Charles Procedure Date: 01/13/2024 7:07 AM MRN: [id number] Date of Birth: [birth year] Attending MD: Sanjuan Dame , MD, [id number] CSN: [id number] Age: 58 Admit Type: Outpatient Procedure:                Colonoscopy Indications:              High risk colon cancer surveillance: Personal                            history of colonic polyps, family history of colon                            cancer Providers:                Sanjuan Dame, MD, Sheran Fava, [place] Wilson,                            Tonya Wilson Referring MD:              Medicines:                Monitored Anesthesia Care Complications:            No immediate complications. Estimated Blood Loss:     Estimated blood loss: none. Procedure:                Pre-Anesthesia Assessment:                           - Prior to the procedure, a History and Physical                            was performed, and patient medications and                            allergies were reviewed. The patient's tolerance of                            previous anesthesia was also reviewed. The risks                            and benefits of the procedure and the sedation                            options and risks were discussed with the patient.                            All questions were answered, and informed consent                            was obtained. Prior Anticoagulants: The patient has                            taken no anticoagulant or antiplatelet agents. ASA                            Grade Assessment: II -  A patient with mild systemic                            disease. After reviewing the risks and benefits,                            the patient was deemed in satisfactory condition to                            undergo the procedure.                           After obtaining informed consent, the colonoscope                            was passed under direct vision. Throughout the                             procedure, the patient's blood pressure, pulse, and                            oxygen saturations were monitored continuously. The                            [id number] scope was introduced through                            the anus and advanced to the the cecum, identified                            by appendiceal orifice and ileocecal valve. The                            colonoscopy was performed without difficulty. The                            patient tolerated the procedure well. The quality                            of the bowel preparation was evaluated using the                            BBPS [facility] Bowel Preparation Scale) with scores                            of: Right Colon = 3, Transverse Colon = 3 and Left                            Colon = 3 (entire mucosa seen well with no residual                            staining, small fragments of stool or opaque  liquid). The total BBPS score equals 9. The                            ileocecal valve, appendiceal orifice, and rectum                            were photographed. Scope In: 7:32:58 AM Scope Out: 7:48:09 AM Scope Withdrawal Time: 0 hours 9 minutes 36 seconds  Total Procedure Duration: 0 hours 15 minutes 11 seconds  Findings:      A 5 mm polyp was found in the ascending colon. The polyp was sessile.       The polyp was removed with a cold snare. Resection and retrieval were       complete.      The exam was otherwise without abnormality.      Non-bleeding internal hemorrhoids were found during retroflexion. The       hemorrhoids were small. Impression:               - One 5 mm polyp in the ascending colon, removed                            with a cold snare. Resected and retrieved.                           - The examination was otherwise normal.                           - Non-bleeding internal hemorrhoids. Moderate Sedation:      Per Anesthesia  Care Recommendation:           - Await pathology results.                           - Patient has a contact number available for                            emergencies. The signs and symptoms of potential                            delayed complications were discussed with the                            patient. Return to normal activities tomorrow.                            Written discharge instructions were provided to the                            patient.                           - Resume previous diet.                           - Continue present medications.                           -  Await pathology results.                           - Repeat colonoscopy in 5 years for surveillance.                           - Return to primary care physician as previously                            scheduled. Procedure Code(s):        --- Professional ---                           [phone], Colonoscopy, flexible; with removal of                            tumor(s), polyp(s), or other lesion(s) by snare                            technique Diagnosis Code(s):        --- Professional ---                           Z86.010, Personal history of colonic polyps                           D12.2, Benign neoplasm of ascending colon                           K64.8, Other hemorrhoids CPT copyright 2022 American Medical Association. All rights reserved. The codes documented in this report are preliminary and upon coder review may  be revised to meet current compliance requirements. Terril Fetters, MD Terril Fetters, MD 01/13/2024 7:53:38 AM This report has been signed electronically. Number of Addenda: 0

## 2024-01-13 NOTE — H&P (Addendum)
 Primary Care Physician:  Donetta Potts, MD Primary Gastroenterologist:  Dr. Tasia Catchings  Pre-Procedure History & Physical: HPI:  Rachael Charles is a 58 y.o. female is here for a colonoscopy for colon polyp surveillance.  Family history positive for colon carcinoma in 1 of the grandparents..  No melena or hematochezia.  No abdominal pain or unintentional weight loss.  No change in bowel habits.  Overall feels well from a GI standpoint.   Colonoscopy 2020  - One 5 mm polyp in the distal sigmoid colon, removed with a cold snare. Resected and retrieved. 2020  Past Medical History:  Diagnosis Date   Hyperlipemia    Hypertension    Inflammatory bowel disease    Migraine headache    Portal vein thrombosis    Pre-diabetes     Past Surgical History:  Procedure Laterality Date   ANTERIOR CERVICAL DECOMP/DISCECTOMY FUSION  2006   CERVICAL FUSION  2006   COLONOSCOPY     2009   COLONOSCOPY N/A 10/29/2018   Procedure: COLONOSCOPY;  Surgeon: Malissa Hippo, MD;  Location: [facility];  Service: Endoscopy;  Laterality: N/A;  730   DILATION AND CURETTAGE OF UTERUS     times 2   POLYPECTOMY  10/29/2018   Procedure: POLYPECTOMY;  Surgeon: Malissa Hippo, MD;  Location: [facility];  Service: Endoscopy;;  distal sigmoid colon(CSx1)   TONSILLECTOMY     At age 73    Prior to Admission medications   Medication Sig Start Date End Date Taking? Authorizing Provider  aspirin-acetaminophen-caffeine (EXCEDRIN MIGRAINE) [id number] MG tablet Take 2 tablets by mouth daily as needed for headache.   Yes [provider]  atorvastatin (LIPITOR) 10 MG tablet SMARTSIG:1 Tablet(s) By Mouth Every Evening   Yes [provider]  imipramine (TOFRANIL) 50 MG tablet Take 150 mg by mouth at bedtime.   Yes [provider]  rizatriptan (MAXALT) 10 MG tablet Take 10 mg by mouth as needed for migraine. May repeat in 2 hours if needed   Yes [provider]   triamterene-hydrochlorothiazide (MAXZIDE-25) 37.5-25 MG tablet Take 0.5 tablets by mouth daily.   Yes [provider]  dicyclomine (BENTYL) 10 MG capsule Take 1 capsule (10 mg total) by mouth 3 (three) times daily as needed. Patient not taking: Reported on 12/12/2023 10/29/18   Malissa Hippo, MD  docusate sodium (COLACE) 100 MG capsule Take 2 capsules (200 mg total) by mouth daily. Patient not taking: Reported on 12/12/2023 08/16/14   Malissa Hippo, MD    Allergies as of 12/18/2023 - Review Complete 12/12/2023  Allergen Reaction Noted   Metoprolol Other (See Comments) 06/15/2017   Diclofenac Nausea Only 06/15/2017    Family History  Problem Relation Age of Onset   Thyroid disease Mother    Healthy Father    Hypertension Brother    Thyroid disease Brother    Healthy Brother    Healthy Child     Social History   Socioeconomic History   Marital status: Married    Spouse name: Not on file   Number of children: Not on file   Years of education: Not on file   Highest education level: Not on file  Occupational History   Not on file  Tobacco Use   Smoking status: Never   Smokeless tobacco: Never  Vaping Use   Vaping status: Never Used  Substance and Sexual Activity   Alcohol use: Yes    Alcohol/week: 0.0 standard drinks of alcohol  Comment: Social - every 1-2 weeks   Drug use: No   Sexual activity: Not on file  Other Topics Concern   Not on file  Social History Narrative   Not on file   Social Drivers of Health   Financial Resource Strain: Low Risk  (11/29/2022)   Received from [facility]   Overall Financial Resource Strain (CARDIA)    Difficulty of Paying Living Expenses: Not hard at all  Food Insecurity: No Food Insecurity (11/29/2022)   Received from [facility]   Hunger Vital Sign    Worried About Running Out of Food in the Last Year: Never true    Ran Out of Food in the Last Year: Never true  Transportation Needs: No Transportation Needs  (11/29/2022)   Received from [facility]   PRAPARE - Transportation    Lack of Transportation (Medical): No    Lack of Transportation (Non-Medical): No  Physical Activity: Inactive (11/29/2022)   Received from [facility]   Exercise Vital Sign    Days of Exercise per Week: 0 days    Minutes of Exercise per Session: 0 min  Stress: Stress Concern Present (11/29/2022)   Received from [facility] of Occupational Health - Occupational Stress Questionnaire    Feeling of Stress : Rather much  Social Connections: Moderately Integrated (11/29/2022)   Received from [facility]   Social Connection and Isolation Panel [NHANES]    Frequency of Communication with Friends and Family: More than three times a week    Frequency of Social Gatherings with Friends and Family: More than three times a week    Attends Religious Services: More than 4 times per year    Active Member of Clubs or Organizations: No    Attends Banker Meetings: Never    Marital Status: Married  Catering manager Violence: Not At Risk (11/29/2022)   Received from [facility]   Humiliation, Afraid, Rape, and Kick questionnaire    Fear of Current or Ex-Partner: No    Emotionally Abused: No    Physically Abused: No    Sexually Abused: No    Review of Systems: See HPI, otherwise negative ROS  Physical Exam: Vital signs in last 24 hours: Temp:  [98.2 F (36.8 C)] 98.2 F (36.8 C) (04/14 0647) Pulse Rate:  [91] 91 (04/14 0647) Resp:  [15] 15 (04/14 0647) BP: (175)/(86) 175/86 (04/14 0647) SpO2:  [100 %] 100 % (04/14 0647) Weight:  [61.7 kg] 61.7 kg (04/14 0647)   General:   Alert,  Well-developed, well-nourished, pleasant and cooperative in NAD Head:  Normocephalic and atraumatic. Eyes:  Sclera clear, no icterus.   Conjunctiva pink. Ears:  Normal auditory acuity. Nose:  No deformity, discharge,  or lesions. Msk:  Symmetrical without gross deformities. Normal  posture. Extremities:  Without clubbing or edema. Neurologic:  Alert and  oriented x4;  grossly normal neurologically. Skin:  Intact without significant lesions or rashes. Psych:  Alert and cooperative. Normal mood and affect.  Impression/Plan:  Rachael Charles is a 57 y.o. female is here for a colonoscopy for colon polyp surveillance.   The risks of the procedure including infection, bleed, or perforation as well as benefits, limitations, alternatives and imponderables have been reviewed with the patient. Questions have been answered. All parties agreeable.

## 2024-01-13 NOTE — Discharge Instructions (Signed)

## 2024-01-13 NOTE — Transfer of Care (Signed)
 Immediate Anesthesia Transfer of Care Note  Patient: Rachael Charles  Procedure(s) Performed: COLONOSCOPY  Patient Location: Endoscopy Unit  Anesthesia Type:General  Level of Consciousness: awake, alert , and oriented  Airway & Oxygen Therapy: Patient Spontanous Breathing  Post-op Assessment: Report given to RN and Post -op Vital signs reviewed and stable  Post vital signs: Reviewed and stable  Last Vitals:  Vitals Value Taken Time  BP    Temp    Pulse    Resp    SpO2      Last Pain:  Vitals:   01/13/24 0730  TempSrc:   PainSc: 0-No pain      Patients Stated Pain Goal: 8 (01/13/24 0647)  Complications: No notable events documented.

## 2024-01-13 NOTE — Anesthesia Procedure Notes (Signed)
 Date/Time: 01/13/2024 7:27 AM  Performed by: Sherwin Donate, CRNAPre-anesthesia Checklist: Patient identified, Emergency Drugs available, Suction available and Patient being monitored Patient Re-evaluated:Patient Re-evaluated prior to induction Oxygen Delivery Method: Nasal cannula Induction Type: IV induction Placement Confirmation: positive ETCO2

## 2024-01-14 ENCOUNTER — Encounter (HOSPITAL_COMMUNITY): Payer: Self-pay | Admitting: Gastroenterology

## 2024-01-14 ENCOUNTER — Encounter (INDEPENDENT_AMBULATORY_CARE_PROVIDER_SITE_OTHER): Payer: Self-pay | Admitting: *Deleted

## 2024-01-14 LAB — SURGICAL PATHOLOGY

## 2024-01-15 NOTE — Progress Notes (Signed)
 I reviewed the pathology results. Rachael Charles, can you send her a letter with the findings as described below please? Repeat colonoscopy in 5 years  Thanks,  Trey Bebee [name], MD Gastroenterology and Hepatology [facility]  ---------------------------------------------------------------------------------------------  [facility] [address], [place], [place] [address] Phone:  [phone]   01/15/24 Rachael Charles, [place]   Dear Rachael Charles,  I am writing to inform you that the biopsies taken during your recent endoscopic examination showed:   A. COLON, ASCENDING, POLYPECTOMY:  - Tubular adenoma  - Negative for high-grade dysplasia or malignancy    What does this mean?   You had a total of 1 polyp removed. The pathology came back as "tubular adenoma." These findings are NOT cancer, but had the polyps remained in your colon, they could have turned into cancer.  Given these findings, it is recommended that your next colonoscopy be performed in 5 years given family history of colon cancer .  Also I value your feedback , so if you get a survey , please take the time to fill it out and thank you for choosing [email or web address]  Please call us  at [phone] if you have persistent problems or have questions about your condition that have not been fully answered at this time.  Sincerely,  Makailyn Mccormick Faizan Kolbey Teichert, MD Gastroenterology and Hepatology

## 2024-01-20 ENCOUNTER — Encounter (INDEPENDENT_AMBULATORY_CARE_PROVIDER_SITE_OTHER): Payer: Self-pay | Admitting: *Deleted

## 2024-01-29 ENCOUNTER — Other Ambulatory Visit: Payer: Self-pay | Admitting: Internal Medicine

## 2024-01-29 DIAGNOSIS — Z1231 Encounter for screening mammogram for malignant neoplasm of breast: Secondary | ICD-10-CM

## 2024-03-11 ENCOUNTER — Ambulatory Visit
Admission: RE | Admit: 2024-03-11 | Discharge: 2024-03-11 | Disposition: A | Discharge status: Home / Self Care | Source: Ambulatory Visit | Attending: Internal Medicine | Admitting: Internal Medicine

## 2024-03-11 DIAGNOSIS — Z1231 Encounter for screening mammogram for malignant neoplasm of breast: Secondary | ICD-10-CM
